# Patient Record
Sex: Female | Born: 1990
Health system: Southern US, Community
[De-identification: ages and names within clinical notes are randomized; demographics above are authoritative.]

## PROBLEM LIST (undated history)

## (undated) DIAGNOSIS — Z6827 Body mass index (BMI) 27.0-27.9, adult: Secondary | ICD-10-CM

## (undated) HISTORY — DX: Body mass index (BMI) 27.0-27.9, adult: Z68.27

---

## 2019-01-04 DIAGNOSIS — Z3009 Encounter for other general counseling and advice on contraception: Secondary | ICD-10-CM | POA: Diagnosis not present

## 2019-01-04 DIAGNOSIS — Z118 Encounter for screening for other infectious and parasitic diseases: Secondary | ICD-10-CM | POA: Diagnosis not present

## 2019-01-18 DIAGNOSIS — Z3202 Encounter for pregnancy test, result negative: Secondary | ICD-10-CM | POA: Diagnosis not present

## 2019-01-18 DIAGNOSIS — Z30430 Encounter for insertion of intrauterine contraceptive device: Secondary | ICD-10-CM | POA: Diagnosis not present

## 2019-02-02 ENCOUNTER — Other Ambulatory Visit: Payer: Self-pay

## 2019-02-02 DIAGNOSIS — Z20822 Contact with and (suspected) exposure to covid-19: Secondary | ICD-10-CM

## 2019-02-04 LAB — NOVEL CORONAVIRUS, NAA: SARS-CoV-2, NAA: NOT DETECTED

## 2019-03-31 DIAGNOSIS — Z114 Encounter for screening for human immunodeficiency virus [HIV]: Secondary | ICD-10-CM | POA: Diagnosis not present

## 2019-03-31 DIAGNOSIS — Z6830 Body mass index (BMI) 30.0-30.9, adult: Secondary | ICD-10-CM | POA: Diagnosis not present

## 2019-03-31 DIAGNOSIS — R87610 Atypical squamous cells of undetermined significance on cytologic smear of cervix (ASC-US): Secondary | ICD-10-CM | POA: Diagnosis not present

## 2019-03-31 DIAGNOSIS — Z118 Encounter for screening for other infectious and parasitic diseases: Secondary | ICD-10-CM | POA: Diagnosis not present

## 2019-03-31 DIAGNOSIS — Z1159 Encounter for screening for other viral diseases: Secondary | ICD-10-CM | POA: Diagnosis not present

## 2019-03-31 DIAGNOSIS — Z116 Encounter for screening for other protozoal diseases and helminthiases: Secondary | ICD-10-CM | POA: Diagnosis not present

## 2019-03-31 DIAGNOSIS — Z113 Encounter for screening for infections with a predominantly sexual mode of transmission: Secondary | ICD-10-CM | POA: Diagnosis not present

## 2019-03-31 DIAGNOSIS — Z01419 Encounter for gynecological examination (general) (routine) without abnormal findings: Secondary | ICD-10-CM | POA: Diagnosis not present

## 2019-05-02 ENCOUNTER — Telehealth: Payer: Self-pay | Admitting: Physician Assistant

## 2019-05-02 DIAGNOSIS — R21 Rash and other nonspecific skin eruption: Secondary | ICD-10-CM

## 2019-05-02 MED ORDER — CLOTRIMAZOLE 1 % EX CREA: 1.0000 "application " | TOPICAL_CREAM | Freq: Two times a day (BID) | CUTANEOUS | 0 refills | Status: DC

## 2019-05-02 MED FILL — ANTIFUNGAL CLOTRIMAZOLE 1 %: 1 | 7 days supply | Qty: 28 | Fill #0

## 2019-05-02 NOTE — Progress Notes (Signed)
E Visit for Rash  We are sorry that you are not feeling well. Here is how we plan to help!  Based upon your presentation it appears you have a fungal infection.  I have prescribed: clotrimazole cream apply to the affected area twice daily   HOME CARE:   Take cool showers and avoid direct sunlight.  Apply cool compress or wet dressings.  Take a bath in an oatmeal bath.  Sprinkle content of one Aveeno packet under running faucet with comfortably warm water.  Bathe for 15-20 minutes, 1-2 times daily.  Pat dry with a towel. Do not rub the rash.  Use hydrocortisone cream.  Take an antihistamine like Benadryl for widespread rashes that itch.  The adult dose of Benadryl is 25-50 mg by mouth 4 times daily.  Caution:  This type of medication may cause sleepiness.  Do not drink alcohol, drive, or operate dangerous machinery while taking antihistamines.  Do not take these medications if you have prostate enlargement.  Read package instructions thoroughly on all medications that you take.  GET HELP RIGHT AWAY IF:   Symptoms don't go away after treatment.  Severe itching that persists.  If you rash spreads or swells.  If you rash begins to smell.  If it blisters and opens or develops a yellow-brown crust.  You develop a fever.  You have a sore throat.  You become short of breath.  MAKE SURE YOU:  Understand these instructions. Will watch your condition. Will get help right away if you are not doing well or get worse.  Thank you for choosing an e-visit. Your e-visit answers were reviewed by a board certified advanced clinical practitioner to complete your personal care plan. Depending upon the condition, your plan could have included both over the counter or prescription medications. Please review your pharmacy choice. Be sure that the pharmacy you have chosen is open so that you can pick up your prescription now.  If there is a problem you may message your provider in [place] to  have the prescription routed to another pharmacy. Your safety is important to [place]. If you have drug allergies check your prescription carefully.  For the next 24 hours, you can use MyChart to ask questions about today's visit, request a non-urgent call back, or ask for a work or school excuse from your e-visit provider. You will get an email in the next two days asking about your experience. I hope that your e-visit has been valuable and will speed your recovery.  Greater than 5 minutes, yet less than 10 minutes of time have been spent researching, coordinating, and implementing care for this patient today.

## 2019-05-30 ENCOUNTER — Telehealth: Payer: Self-pay | Admitting: Family

## 2019-05-30 DIAGNOSIS — B354 Tinea corporis: Secondary | ICD-10-CM

## 2019-05-30 MED ORDER — KETOCONAZOLE 2 % EX CREA: 1.0000 "application " | TOPICAL_CREAM | Freq: Two times a day (BID) | CUTANEOUS | 0 refills | Status: DC

## 2019-05-30 NOTE — Progress Notes (Signed)
E Visit for Rash  We are sorry that you are not feeling well. Here is how we plan to help!    Based upon your presentation it appears you have a fungal infection or ring worm.  I have prescribed ketoconazole 2% cream twice a day. This is slightly stronger than the cream prior. It may take up to three weeks to heal. Keep clean and dry and avoid scratching.    HOME CARE:   Take cool showers and avoid direct sunlight.  Apply cool compress or wet dressings.  Take a bath in an oatmeal bath.  Sprinkle content of one Aveeno packet under running faucet with comfortably warm water.  Bathe for 15-20 minutes, 1-2 times daily.  Pat dry with a towel. Do not rub the rash.  Use hydrocortisone cream.  Take an antihistamine like Benadryl for widespread rashes that itch.  The adult dose of Benadryl is 25-50 mg by mouth 4 times daily.  Caution:  This type of medication may cause sleepiness.  Do not drink alcohol, drive, or operate dangerous machinery while taking antihistamines.  Do not take these medications if you have prostate enlargement.  Read package instructions thoroughly on all medications that you take.  GET HELP RIGHT AWAY IF:   Symptoms don't go away after treatment.  Severe itching that persists.  If you rash spreads or swells.  If you rash begins to smell.  If it blisters and opens or develops a yellow-brown crust.  You develop a fever.  You have a sore throat.  You become short of breath.  MAKE SURE YOU:  Understand these instructions. Will watch your condition. Will get help right away if you are not doing well or get worse.  Thank you for choosing an e-visit. Your e-visit answers were reviewed by a board certified advanced clinical practitioner to complete your personal care plan. Depending upon the condition, your plan could have included both over the counter or prescription medications. Please review your pharmacy choice. Be sure that the pharmacy you have chosen  is open so that you can pick up your prescription now.  If there is a problem you may message your provider in [place] to have the prescription routed to another pharmacy. Your safety is important to [place]. If you have drug allergies check your prescription carefully.  For the next 24 hours, you can use MyChart to ask questions about today's visit, request a non-urgent call back, or ask for a work or school excuse from your e-visit provider. You will get an email in the next two days asking about your experience. I hope that your e-visit has been valuable and will speed your recovery.    Approximately 5 minutes was spent documenting and reviewing patient's chart.

## 2019-06-16 MED FILL — KETOCONAZOLE 2% CREAM: 2 | 30 days supply | Qty: 60 | Fill #0

## 2019-08-15 DIAGNOSIS — B354 Tinea corporis: Secondary | ICD-10-CM | POA: Diagnosis not present

## 2019-08-15 DIAGNOSIS — D225 Melanocytic nevi of trunk: Secondary | ICD-10-CM | POA: Diagnosis not present

## 2019-11-15 DIAGNOSIS — D225 Melanocytic nevi of trunk: Secondary | ICD-10-CM | POA: Diagnosis not present

## 2019-11-15 DIAGNOSIS — L700 Acne vulgaris: Secondary | ICD-10-CM | POA: Diagnosis not present

## 2019-11-25 DIAGNOSIS — U071 COVID-19: Secondary | ICD-10-CM

## 2019-11-25 HISTORY — DX: COVID-19: U07.1

## 2019-12-24 DIAGNOSIS — Z20822 Contact with and (suspected) exposure to covid-19: Secondary | ICD-10-CM | POA: Diagnosis not present

## 2019-12-26 ENCOUNTER — Other Ambulatory Visit (HOSPITAL_COMMUNITY): Payer: Self-pay

## 2019-12-27 ENCOUNTER — Ambulatory Visit (HOSPITAL_COMMUNITY)
Admission: RE | Admit: 2019-12-27 | Discharge: 2019-12-27 | Disposition: A | Discharge status: Home / Self Care | Payer: 59 | Source: Ambulatory Visit | Attending: Pulmonary Disease | Admitting: Pulmonary Disease

## 2019-12-27 ENCOUNTER — Other Ambulatory Visit: Payer: Self-pay | Admitting: Physician Assistant

## 2019-12-27 ENCOUNTER — Encounter: Payer: Self-pay | Admitting: Physician Assistant

## 2019-12-27 DIAGNOSIS — Z6827 Body mass index (BMI) 27.0-27.9, adult: Secondary | ICD-10-CM | POA: Diagnosis not present

## 2019-12-27 DIAGNOSIS — U071 COVID-19: Secondary | ICD-10-CM | POA: Diagnosis not present

## 2019-12-27 MED ORDER — SOTROVIMAB 500 MG/8ML IV SOLN
500.0000 mg | Freq: Once | INTRAVENOUS | Status: AC
  Administered 2019-12-27: 500 mg via INTRAVENOUS

## 2019-12-27 MED ORDER — DIPHENHYDRAMINE HCL 50 MG/ML IJ SOLN: 50.0000 mg | Freq: Once | INTRAMUSCULAR | Status: DC | PRN

## 2019-12-27 MED ORDER — METHYLPREDNISOLONE SODIUM SUCC 125 MG IJ SOLR: 125.0000 mg | Freq: Once | INTRAMUSCULAR | Status: DC | PRN

## 2019-12-27 MED ORDER — FAMOTIDINE IN NACL 20-0.9 MG/50ML-% IV SOLN: 20.0000 mg | Freq: Once | INTRAVENOUS | Status: DC | PRN

## 2019-12-27 MED ORDER — ALBUTEROL SULFATE HFA 108 (90 BASE) MCG/ACT IN AERS: 2.0000 | INHALATION_SPRAY | Freq: Once | RESPIRATORY_TRACT | Status: DC | PRN

## 2019-12-27 MED ORDER — EPINEPHRINE 0.3 MG/0.3ML IJ SOAJ: 0.3000 mg | Freq: Once | INTRAMUSCULAR | Status: DC | PRN

## 2019-12-27 MED ORDER — SODIUM CHLORIDE 0.9 % IV SOLN: INTRAVENOUS | Status: DC | PRN

## 2019-12-27 NOTE — Progress Notes (Signed)
Diagnosis: COVID-19  Physician:Dr. Joya Gaskins  Procedure:[facility] MPN:[id number] - Provided patient with Sotrovimabfact sheet for patients, parents and caregivers prior to infusion.   Complications:No immediate complications  Discharge: Discharged home

## 2019-12-27 NOTE — Discharge Instructions (Signed)

## 2019-12-27 NOTE — Progress Notes (Signed)
I connected by phone with Linda Santos on 12/27/2019 at 11:23 AM to discuss the potential use of a new treatment for mild to moderate COVID-19 viral infection in non-hospitalized patients.  This patient is a 29 y.o. female that meets the FDA criteria for Emergency Use Authorization of COVID monoclonal antibody sotrovimab, casirivimab/imdevimab or bamlamivimab/estevimab.  Has a (+) direct SARS-CoV-2 viral test result  Has mild or moderate COVID-19   Is NOT hospitalized due to COVID-19  Is within 10 days of symptom onset  Has at least one of the high risk factor(s) for progression to severe COVID-19 and/or hospitalization as defined in EUA.  Specific high risk criteria : BMI > 25   I have spoken and communicated the following to the patient or parent/caregiver regarding COVID monoclonal antibody treatment:  1. FDA has authorized the emergency use for the treatment of mild to moderate COVID-19 in adults and pediatric patients with positive results of direct SARS-CoV-2 viral testing who are 57 years of age and older weighing at least 40 kg, and who are at high risk for progressing to severe COVID-19 and/or hospitalization.  2. The significant known and potential risks and benefits of COVID monoclonal antibody, and the extent to which such potential risks and benefits are unknown.  3. Information on available alternative treatments and the risks and benefits of those alternatives, including clinical trials.  4. Patients treated with COVID monoclonal antibody should continue to self-isolate and use infection control measures (e.g., wear mask, isolate, social distance, avoid sharing personal items, clean and disinfect "high touch" surfaces, and frequent handwashing) according to CDC guidelines.   5. The patient or parent/caregiver has the option to accept or refuse COVID monoclonal antibody treatment.  After reviewing this information with the patient, the patient has agreed to receive one of  the available covid 19 monoclonal antibodies and will be provided an appropriate fact sheet prior to infusion.  Sx onset 10/28. Set up for infusion on 11/2 @ 1:30pm . Directions given to [facility]. Pt is aware that insurance will be charged an infusion fee. Pt is unvaccinated.   Linda Santos 12/27/2019 11:23 AM

## 2020-01-12 ENCOUNTER — Emergency Department (INDEPENDENT_AMBULATORY_CARE_PROVIDER_SITE_OTHER)
Admission: EM | Admit: 2020-01-12 | Discharge: 2020-01-12 | Disposition: A | Discharge status: Home / Self Care | Payer: 59 | Source: Home / Self Care

## 2020-01-12 ENCOUNTER — Telehealth: Payer: 59 | Admitting: Emergency Medicine

## 2020-01-12 ENCOUNTER — Other Ambulatory Visit: Payer: Self-pay

## 2020-01-12 ENCOUNTER — Emergency Department (INDEPENDENT_AMBULATORY_CARE_PROVIDER_SITE_OTHER): Payer: 59

## 2020-01-12 ENCOUNTER — Encounter: Payer: Self-pay | Admitting: Emergency Medicine

## 2020-01-12 DIAGNOSIS — J0130 Acute sphenoidal sinusitis, unspecified: Secondary | ICD-10-CM | POA: Diagnosis not present

## 2020-01-12 DIAGNOSIS — G4489 Other headache syndrome: Secondary | ICD-10-CM

## 2020-01-12 DIAGNOSIS — R519 Headache, unspecified: Secondary | ICD-10-CM

## 2020-01-12 MED ORDER — ONDANSETRON 4 MG PO TBDP
4.0000 mg | ORAL_TABLET | Freq: Once | ORAL | Status: AC
  Administered 2020-01-12: 4 mg via ORAL

## 2020-01-12 MED ORDER — FLUTICASONE PROPIONATE 50 MCG/ACT NA SUSP: 2.0000 | Freq: Every day | NASAL | 2 refills | Status: AC

## 2020-01-12 MED ORDER — KETOROLAC TROMETHAMINE 60 MG/2ML IM SOLN
60.0000 mg | Freq: Once | INTRAMUSCULAR | Status: AC
  Administered 2020-01-12: 60 mg via INTRAMUSCULAR

## 2020-01-12 MED ORDER — DEXAMETHASONE SODIUM PHOSPHATE 10 MG/ML IJ SOLN
10.0000 mg | Freq: Once | INTRAMUSCULAR | Status: AC
  Administered 2020-01-12: 10 mg via INTRAMUSCULAR

## 2020-01-12 MED ORDER — DOXYCYCLINE HYCLATE 100 MG PO CAPS: 100.0000 mg | ORAL_CAPSULE | Freq: Two times a day (BID) | ORAL | 0 refills | Status: AC
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
## 2020-01-12 NOTE — Discharge Instructions (Signed)
°  Please take antibiotics as prescribed and be sure to complete entire course even if you start to feel better to ensure infection does not come back.  Call to schedule a follow up appointment with primary care or the post-COVID care center if you continue to have a headache. The post-COVID care center can recheck symptoms and refer to specialists if needed.   Call 911 or have someone drive you to the hospital if symptoms significantly worsening including worsening headache, change in vision, difficulty walking, or other new concerning symptoms develop.

## 2020-01-12 NOTE — Progress Notes (Signed)
We are sorry you are not feeling well. We are here to help!  We don't really have an e-visit for treatment of headache in the setting of recent COVID infection, but you can give the following suggestions a try.  Persistent headaches should be evaluated by a PCP or by a neurologist, but it's possible that your headache is related to having had COVID.    In this case, I'd recommend that you continue with nasal saline sprays if you are having sinus congestion.    If you aren't having sinus congestion, then you should try hydrating.  Increase your water intake.   As far as medications, I would try OTC Excedrin Migraine.  If that doesn't work you can try taking 800mg  of ibuprofen and 50mg  of benadryl (don't drive if you do this).  After taking these medications go lay down in a dark quiet room and try to sleep.  If you do not get relief with any of the tips above, I'd recommend that you be seen in person by your doctor or by a neurologist (headache specialist).  Most cases improve 10 days from onset but we have seen a small number of patients who have gotten worse after the 10 days.  Please be sure to watch for worsening symptoms and remain taking the proper precautions.   Go to the nearest hospital ED for assessment if fever/cough/breathlessness are severe or illness seems like a threat to life.    The following symptoms may appear 2-14 days after exposure: . Fever . Cough . Shortness of breath or difficulty breathing . Chills . Repeated shaking with chills . Muscle pain . Headache . Sore throat . New loss of taste or smell . Fatigue . Congestion or runny nose . Nausea or vomiting . Diarrhea   You may also take acetaminophen (Tylenol) as needed for fever.  HOME CARE: . Only take medications as instructed by your medical team. . Drink plenty of fluids and get plenty of rest. . A steam or ultrasonic humidifier can help if you have congestion.   GET HELP RIGHT AWAY IF YOU HAVE  EMERGENCY WARNING SIGNS.  Call 911 or proceed to your closest emergency facility if: . You develop worsening high fever. . Trouble breathing . Bluish lips or face . Persistent pain or pressure in the chest . New confusion . Inability to wake or stay awake . You cough up blood. . Your symptoms become more severe . Inability to hold down food or fluids  This list is not all possible symptoms. Contact your medical provider for any symptoms that are severe or concerning to you.    Your e-visit answers were reviewed by a board certified advanced clinical practitioner to complete your personal care plan.  Depending on the condition, your plan could have included both over the counter or prescription medications.  If there is a problem please reply once you have received a response from your provider.  Your safety is important to [place].  If you have drug allergies check your prescription carefully.    You can use MyChart to ask questions about today's visit, request a non-urgent call back, or ask for a work or school excuse for 24 hours related to this e-Visit. If it has been greater than 24 hours you will need to follow up with your provider, or enter a new e-Visit to address those concerns. You will get an e-mail in the next two days asking about your experience.  I hope that your  e-visit has been valuable and will speed your recovery. Thank you for using e-visits.    Approximately 5 minutes was used in reviewing the patient's chart, questionnaire, prescribing medications, and documentation.

## 2020-04-03 DIAGNOSIS — Z01419 Encounter for gynecological examination (general) (routine) without abnormal findings: Secondary | ICD-10-CM | POA: Diagnosis not present

## 2020-04-03 DIAGNOSIS — Z113 Encounter for screening for infections with a predominantly sexual mode of transmission: Secondary | ICD-10-CM | POA: Diagnosis not present

## 2020-04-03 DIAGNOSIS — Z30431 Encounter for routine checking of intrauterine contraceptive device: Secondary | ICD-10-CM | POA: Diagnosis not present

## 2020-04-03 DIAGNOSIS — Z6828 Body mass index (BMI) 28.0-28.9, adult: Secondary | ICD-10-CM | POA: Diagnosis not present

## 2020-04-03 DIAGNOSIS — Z124 Encounter for screening for malignant neoplasm of cervix: Secondary | ICD-10-CM | POA: Diagnosis not present

## 2020-04-05 ENCOUNTER — Other Ambulatory Visit: Payer: Self-pay

## 2020-04-05 ENCOUNTER — Encounter: Payer: Self-pay | Admitting: Emergency Medicine

## 2020-04-05 ENCOUNTER — Emergency Department (INDEPENDENT_AMBULATORY_CARE_PROVIDER_SITE_OTHER)
Admission: EM | Admit: 2020-04-05 | Discharge: 2020-04-05 | Disposition: A | Discharge status: Home / Self Care | Payer: 59 | Source: Home / Self Care | Attending: Family Medicine | Admitting: Family Medicine

## 2020-04-05 DIAGNOSIS — S29011A Strain of muscle and tendon of front wall of thorax, initial encounter: Secondary | ICD-10-CM | POA: Diagnosis not present

## 2020-04-05 DIAGNOSIS — S29012A Strain of muscle and tendon of back wall of thorax, initial encounter: Secondary | ICD-10-CM | POA: Diagnosis not present

## 2020-04-05 MED ORDER — ACETAMINOPHEN 325 MG PO TABS
650.0000 mg | ORAL_TABLET | Freq: Once | ORAL | Status: AC
  Administered 2020-04-05: 650 mg via ORAL

## 2020-04-05 MED ORDER — CYCLOBENZAPRINE HCL 10 MG PO TABS: 10.0000 mg | ORAL_TABLET | Freq: Every day | ORAL | 0 refills | Status: AC

## 2020-04-05 NOTE — Discharge Instructions (Addendum)
Apply ice pack for 20 to 30 minutes, 3 to 4 times daily  Continue until pain and swelling decrease.  May take Ibuprofen 200mg, 4 tabs every 8 hours with food.  Begin range of motion and stretching exercises as tolerated. °
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
## 2021-01-23 IMAGING — CT CT HEAD W/O CM
3 of 4 series · 15 of 47 positions shown, 18 images · non-contrast
Comparison: No pertinent prior exams are available for comparison.

CLINICAL DATA: Headache, new or worsening, positional. Additional
history provided: Head COVID with headaches since.

EXAM:
CT HEAD WITHOUT CONTRAST
TECHNIQUE: Contiguous axial images were obtained from the base of the skull
through the vertex without intravenous contrast.

[Series 2: head wo · axial · 0.40mm/px · z∈[+1069,+1189]mm · 9 of 28 slices shown, 12 images]
[im 2/28  brain]
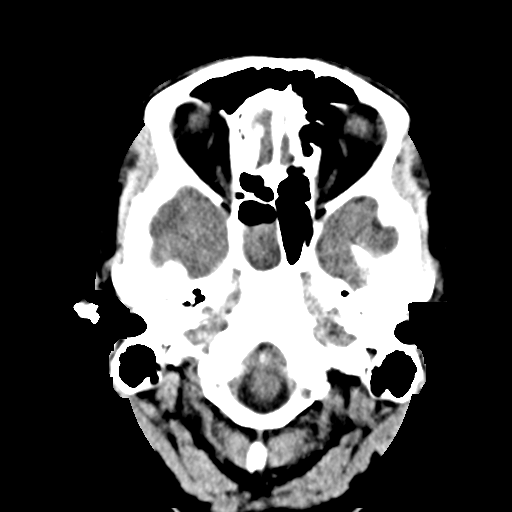
[im 2/28  bone]
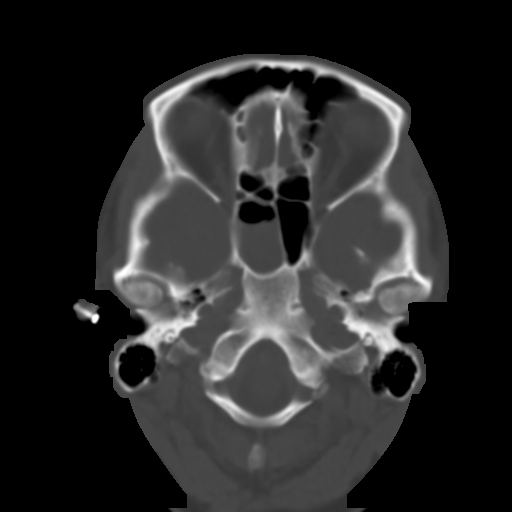
[im 6/28  brain]
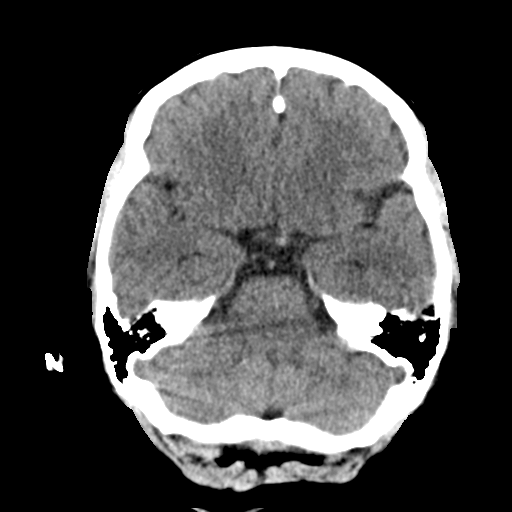
[im 8/28  brain]
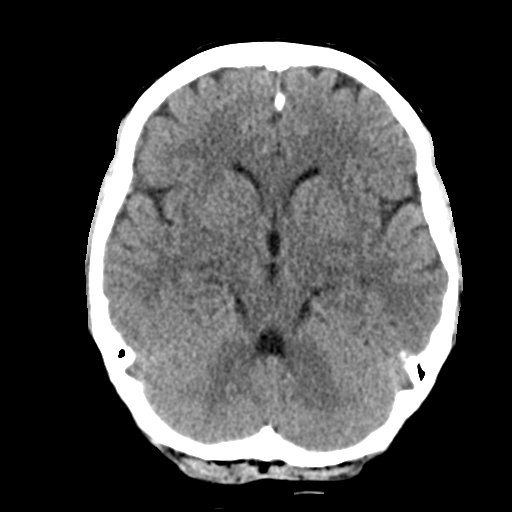
[im 12/28  brain]
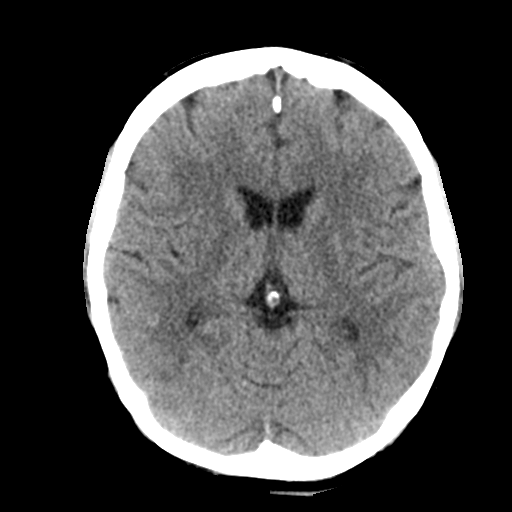
[im 14/28  brain]
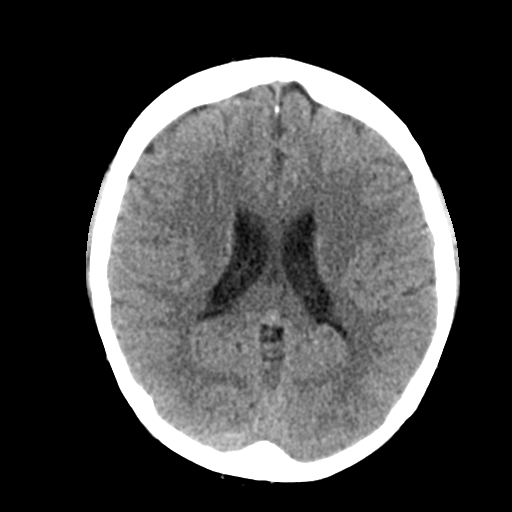
[im 14/28  bone]
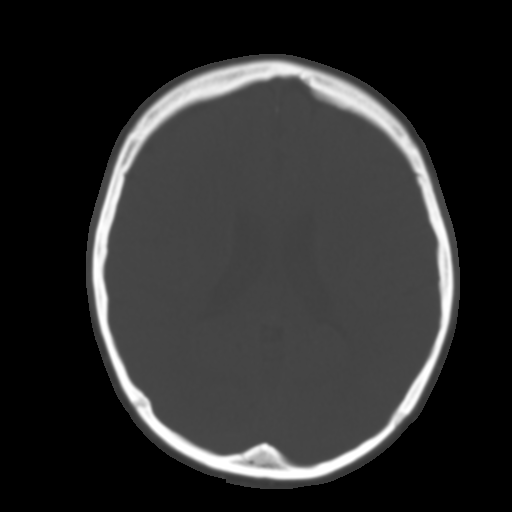
[im 16/28  brain]
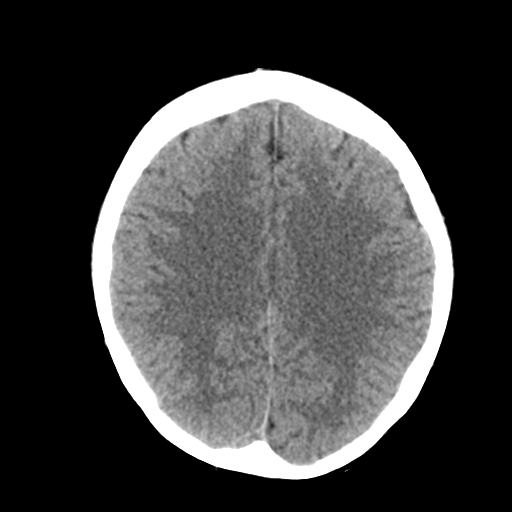
[im 20/28  brain]
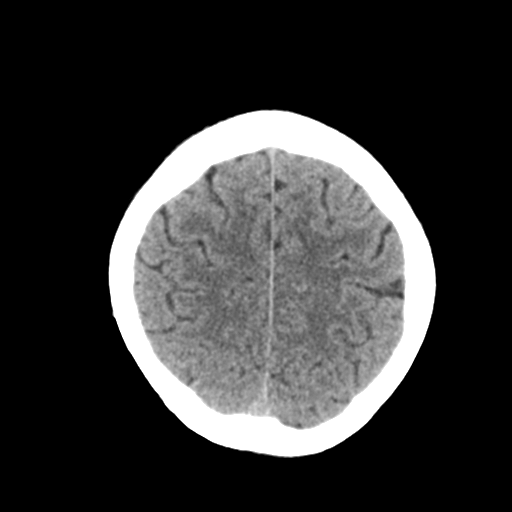
[im 22/28  brain]
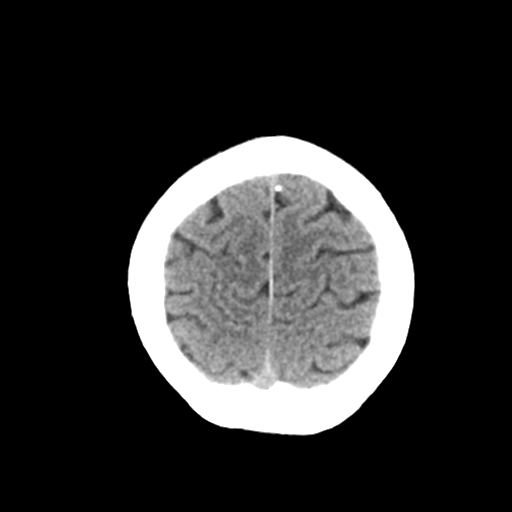
[im 26/28  brain]
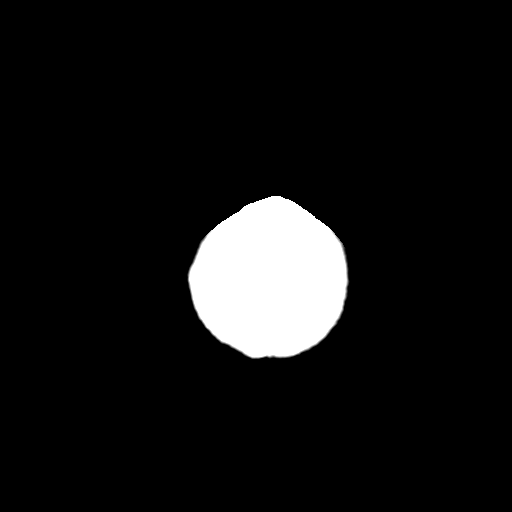
[im 26/28  bone]
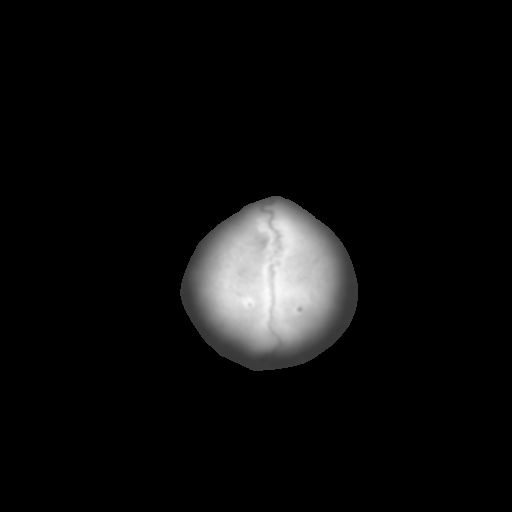

[Series 4: head wo coronal · coronal · 0.27mm/px · 3 of 62 slices shown]
[im 21/62  brain]
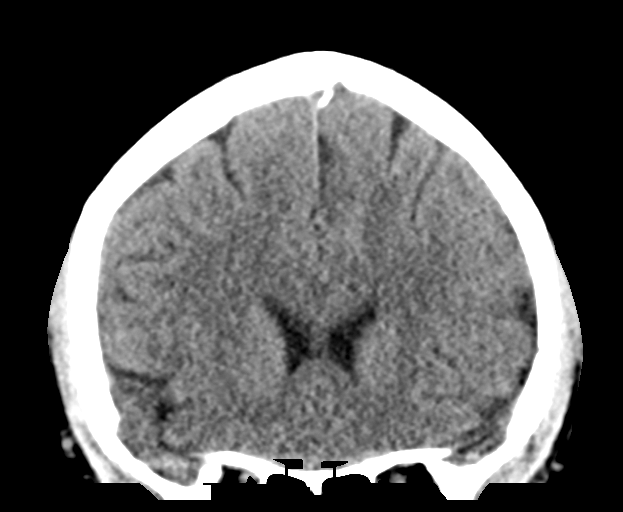
[im 28/62  brain]
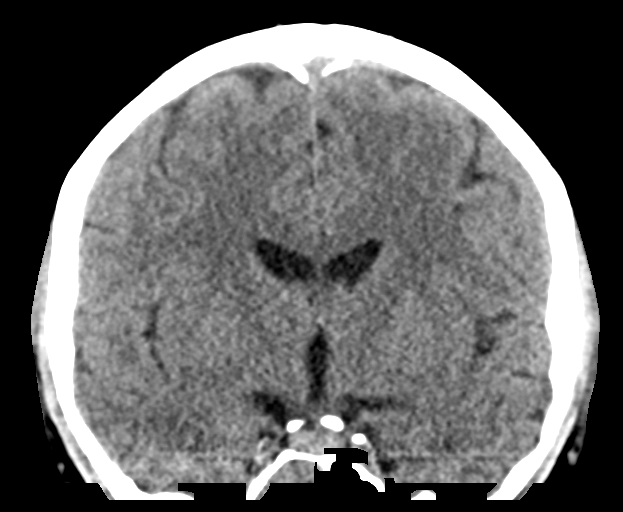
[im 34/62  brain]
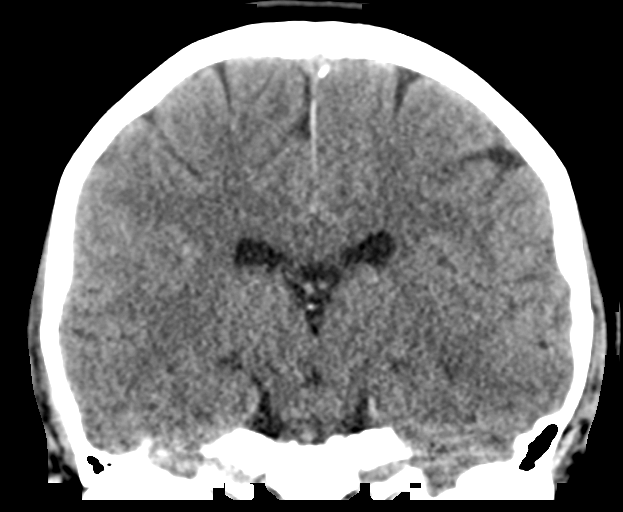

[Series 5: head wo sagittal · sagittal · 0.27mm/px · 3 of 57 slices shown]
[im 19/57  brain]
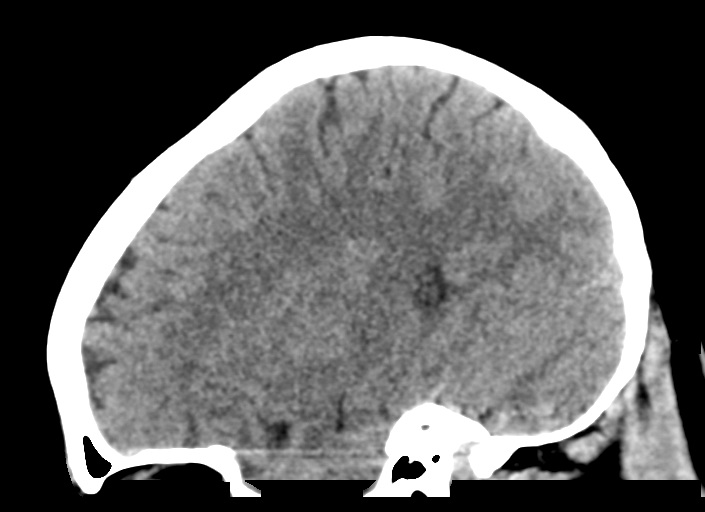
[im 29/57  brain]
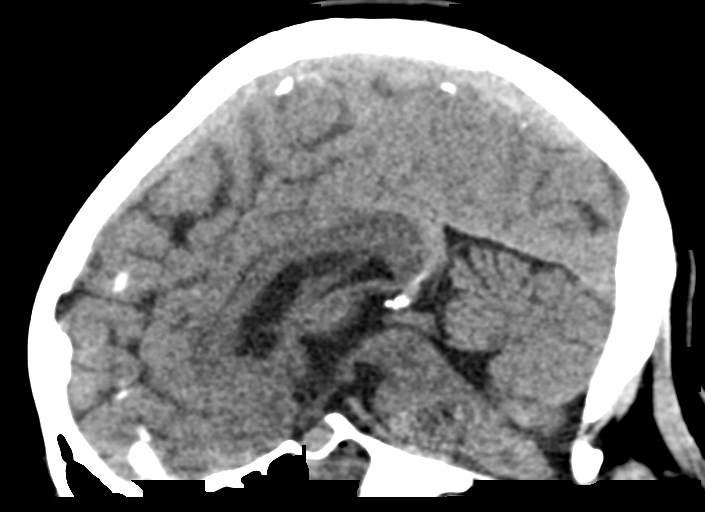
[im 38/57  brain]
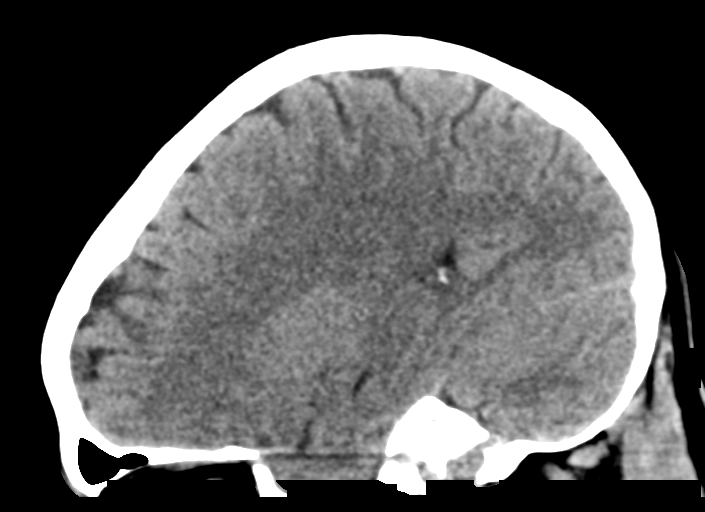

[15 of 47 positions shown; findings below may reference images not displayed]

FINDINGS: Brain:

Cerebral volume is normal.

There is no acute intracranial hemorrhage.

No demarcated cortical infarct.

No extra-axial fluid collection.

No evidence of intracranial mass.

No midline shift.

Vascular: No hyperdense vessel.

Skull: Normal. Negative for fracture or focal lesion.

Sinuses/Orbits: Visualized orbits show no acute finding. Near
complete opacification of the right sphenoid sinus. Frothy
secretions within the left sphenoid sinus.
IMPRESSION: Unremarkable non-contrast CT appearance of the brain. No evidence of
acute intracranial abnormality.

Bilateral sphenoid sinusitis.
